# Patient Record
Sex: Male | Born: 1995 | Race: Black or African American | Hispanic: No | Marital: Single
Health system: Southern US, Community
[De-identification: ages and names within clinical notes are randomized; demographics above are authoritative.]

---

## 2021-01-10 ENCOUNTER — Emergency Department (HOSPITAL_COMMUNITY)
Admission: EM | Admit: 2021-01-10 | Discharge: 2021-01-10 | Disposition: A | Discharge status: Home / Self Care | Payer: BLUE CROSS/BLUE SHIELD | Attending: Emergency Medicine | Admitting: Emergency Medicine

## 2021-01-10 ENCOUNTER — Emergency Department (HOSPITAL_COMMUNITY): Payer: BLUE CROSS/BLUE SHIELD

## 2021-01-10 DIAGNOSIS — Z20822 Contact with and (suspected) exposure to covid-19: Secondary | ICD-10-CM | POA: Diagnosis not present

## 2021-01-10 DIAGNOSIS — T1490XA Injury, unspecified, initial encounter: Secondary | ICD-10-CM

## 2021-01-10 DIAGNOSIS — Y93I9 Activity, other involving external motion: Secondary | ICD-10-CM | POA: Insufficient documentation

## 2021-01-10 DIAGNOSIS — S79922A Unspecified injury of left thigh, initial encounter: Secondary | ICD-10-CM | POA: Diagnosis present

## 2021-01-10 DIAGNOSIS — W3400XA Accidental discharge from unspecified firearms or gun, initial encounter: Secondary | ICD-10-CM | POA: Diagnosis not present

## 2021-01-10 DIAGNOSIS — Z23 Encounter for immunization: Secondary | ICD-10-CM | POA: Diagnosis not present

## 2021-01-10 DIAGNOSIS — S71132A Puncture wound without foreign body, left thigh, initial encounter: Secondary | ICD-10-CM | POA: Diagnosis not present

## 2021-01-10 LAB — URINALYSIS, ROUTINE W REFLEX MICROSCOPIC
Bilirubin Urine: NEGATIVE
Glucose, UA: NEGATIVE mg/dL
Ketones, ur: NEGATIVE mg/dL
Leukocytes,Ua: NEGATIVE
Nitrite: NEGATIVE
Protein, ur: NEGATIVE mg/dL
Specific Gravity, Urine: 1.029 (ref 1.005–1.030)
pH: 6 (ref 5.0–8.0)

## 2021-01-10 LAB — COMPREHENSIVE METABOLIC PANEL
ALT: 19 U/L (ref 0–44)
AST: 29 U/L (ref 15–41)
Albumin: 4.1 g/dL (ref 3.5–5.0)
Alkaline Phosphatase: 65 U/L (ref 38–126)
Anion gap: 10 (ref 5–15)
BUN: 6 mg/dL (ref 6–20)
CO2: 24 mmol/L (ref 22–32)
Calcium: 8.6 mg/dL — ABNORMAL LOW (ref 8.9–10.3)
Chloride: 106 mmol/L (ref 98–111)
Creatinine, Ser: 1.65 mg/dL — ABNORMAL HIGH (ref 0.61–1.24)
GFR, Estimated: 59 mL/min — ABNORMAL LOW (ref >60)
Glucose, Bld: 104 mg/dL — ABNORMAL HIGH (ref 70–99)
Potassium: 3.5 mmol/L (ref 3.5–5.1)
Sodium: 140 mmol/L (ref 135–145)
Total Bilirubin: 0.6 mg/dL (ref 0.3–1.2)
Total Protein: 6.6 g/dL (ref 6.5–8.1)

## 2021-01-10 LAB — PROTIME-INR
INR: 1.1 (ref 0.8–1.2)
Prothrombin Time: 13.8 seconds (ref 11.4–15.2)

## 2021-01-10 LAB — CBC
HCT: 45.2 % (ref 39.0–52.0)
Hemoglobin: 14.4 g/dL (ref 13.0–17.0)
MCH: 27.6 pg (ref 26.0–34.0)
MCHC: 31.9 g/dL (ref 30.0–36.0)
MCV: 86.8 fL (ref 80.0–100.0)
Platelets: 259 10*3/uL (ref 150–400)
RBC: 5.21 MIL/uL (ref 4.22–5.81)
RDW: 14.2 % (ref 11.5–15.5)
WBC: 6.8 10*3/uL (ref 4.0–10.5)
nRBC: 0 % (ref 0.0–0.2)

## 2021-01-10 LAB — I-STAT CHEM 8, ED
BUN: 7 mg/dL (ref 6–20)
Calcium, Ion: 1.01 mmol/L — ABNORMAL LOW (ref 1.15–1.40)
Chloride: 107 mmol/L (ref 98–111)
Creatinine, Ser: 1.9 mg/dL — ABNORMAL HIGH (ref 0.61–1.24)
Glucose, Bld: 95 mg/dL (ref 70–99)
HCT: 44 % (ref 39.0–52.0)
Hemoglobin: 15 g/dL (ref 13.0–17.0)
Potassium: 3.6 mmol/L (ref 3.5–5.1)
Sodium: 142 mmol/L (ref 135–145)
TCO2: 23 mmol/L (ref 22–32)

## 2021-01-10 LAB — ETHANOL: Alcohol, Ethyl (B): 209 mg/dL — ABNORMAL HIGH (ref <10)

## 2021-01-10 LAB — RESP PANEL BY RT-PCR (FLU A&B, COVID) ARPGX2
Influenza A by PCR: NEGATIVE
Influenza B by PCR: NEGATIVE
SARS Coronavirus 2 by RT PCR: NEGATIVE

## 2021-01-10 LAB — LACTIC ACID, PLASMA: Lactic Acid, Venous: 3.6 mmol/L (ref 0.5–1.9)

## 2021-01-10 MED ORDER — TETANUS-DIPHTH-ACELL PERTUSSIS 5-2.5-18.5 LF-MCG/0.5 IM SUSY
0.5000 mL | PREFILLED_SYRINGE | Freq: Once | INTRAMUSCULAR | Status: AC
  Administered 2021-01-10: 0.5 mL via INTRAMUSCULAR
  Filled 2021-01-10: qty 0.5

## 2021-01-10 MED ORDER — IOHEXOL 300 MG/ML  SOLN
100.0000 mL | Freq: Once | INTRAMUSCULAR | Status: AC | PRN
  Administered 2021-01-10: 100 mL via INTRAVENOUS

## 2021-01-10 NOTE — ED Notes (Signed)
Patient transported to CT scan . 
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
## 2021-01-10 NOTE — ED Notes (Signed)
Pt encourage to urinate at the request of the Trauma MD. Pt stated multiple times that he does not have to urinate and will not try.
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
## 2021-01-10 NOTE — ED Notes (Signed)
Patient ambulated to toilet with stand- by assist. Respirations unlabored.
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
